# Patient Record
Sex: Male | Born: 1990 | Race: Black or African American | Hispanic: No | Marital: Single | Smoking: Current every day smoker
Health system: Southern US, Community
[De-identification: ages and names within clinical notes are randomized; demographics above are authoritative.]

---

## 2011-02-10 ENCOUNTER — Emergency Department: Payer: Self-pay | Admitting: Emergency Medicine

## 2011-04-27 ENCOUNTER — Emergency Department: Payer: Self-pay | Admitting: Emergency Medicine

## 2011-05-02 ENCOUNTER — Emergency Department: Payer: Self-pay | Admitting: Unknown Physician Specialty

## 2012-08-15 ENCOUNTER — Emergency Department: Payer: Self-pay | Admitting: Emergency Medicine

## 2012-09-05 ENCOUNTER — Emergency Department: Payer: Self-pay | Admitting: Emergency Medicine

## 2012-10-09 ENCOUNTER — Emergency Department: Payer: Self-pay | Admitting: Emergency Medicine

## 2016-05-17 ENCOUNTER — Emergency Department: Payer: No Typology Code available for payment source

## 2016-05-17 ENCOUNTER — Encounter: Payer: Self-pay | Admitting: Emergency Medicine

## 2016-05-17 ENCOUNTER — Emergency Department
Admission: EM | Admit: 2016-05-17 | Discharge: 2016-05-17 | Disposition: A | Discharge status: Home / Self Care | Payer: No Typology Code available for payment source | Attending: Emergency Medicine | Admitting: Emergency Medicine

## 2016-05-17 DIAGNOSIS — R079 Chest pain, unspecified: Secondary | ICD-10-CM | POA: Diagnosis not present

## 2016-05-17 DIAGNOSIS — S42001A Fracture of unspecified part of right clavicle, initial encounter for closed fracture: Secondary | ICD-10-CM

## 2016-05-17 DIAGNOSIS — Y92410 Unspecified street and highway as the place of occurrence of the external cause: Secondary | ICD-10-CM | POA: Insufficient documentation

## 2016-05-17 DIAGNOSIS — F17210 Nicotine dependence, cigarettes, uncomplicated: Secondary | ICD-10-CM | POA: Diagnosis not present

## 2016-05-17 DIAGNOSIS — S42101A Fracture of unspecified part of scapula, right shoulder, initial encounter for closed fracture: Secondary | ICD-10-CM | POA: Insufficient documentation

## 2016-05-17 DIAGNOSIS — Y999 Unspecified external cause status: Secondary | ICD-10-CM | POA: Insufficient documentation

## 2016-05-17 DIAGNOSIS — Y939 Activity, unspecified: Secondary | ICD-10-CM | POA: Insufficient documentation

## 2016-05-17 DIAGNOSIS — R51 Headache: Secondary | ICD-10-CM | POA: Diagnosis not present

## 2016-05-17 DIAGNOSIS — M25511 Pain in right shoulder: Secondary | ICD-10-CM | POA: Diagnosis present

## 2016-05-17 LAB — BASIC METABOLIC PANEL
Anion gap: 9 (ref 5–15)
BUN: 11 mg/dL (ref 6–20)
CO2: 27 mmol/L (ref 22–32)
Calcium: 9.5 mg/dL (ref 8.9–10.3)
Chloride: 105 mmol/L (ref 101–111)
Creatinine, Ser: 0.96 mg/dL (ref 0.61–1.24)
GFR calc Af Amer: >60 mL/min (ref >60)
GFR calc non Af Amer: >60 mL/min (ref >60)
Glucose, Bld: 104 mg/dL — ABNORMAL HIGH (ref 65–99)
Potassium: 3.7 mmol/L (ref 3.5–5.1)
Sodium: 141 mmol/L (ref 135–145)

## 2016-05-17 LAB — CBC
HCT: 44.8 % (ref 40.0–52.0)
HEMOGLOBIN: 15.1 g/dL (ref 13.0–18.0)
MCH: 30.3 pg (ref 26.0–34.0)
MCHC: 33.6 g/dL (ref 32.0–36.0)
MCV: 90.3 fL (ref 80.0–100.0)
PLATELETS: 179 10*3/uL (ref 150–440)
RBC: 4.96 MIL/uL (ref 4.40–5.90)
RDW: 14.3 % (ref 11.5–14.5)
WBC: 14 10*3/uL — ABNORMAL HIGH (ref 3.8–10.6)

## 2016-05-17 MED ORDER — FENTANYL CITRATE (PF) 100 MCG/2ML IJ SOLN
50.0000 ug | Freq: Once | INTRAMUSCULAR | Status: AC
  Administered 2016-05-17: 50 ug via INTRAVENOUS
  Filled 2016-05-17: qty 2

## 2016-05-17 MED ORDER — IOPAMIDOL (ISOVUE-300) INJECTION 61%
75.0000 mL | Freq: Once | INTRAVENOUS | Status: AC | PRN
Start: 2016-05-17 — End: 2016-05-17
  Administered 2016-05-17: 75 mL via INTRAVENOUS
  Filled 2016-05-17: qty 75

## 2016-05-17 MED ORDER — OXYCODONE-ACETAMINOPHEN 5-325 MG PO TABS: 1.0000 | ORAL_TABLET | Freq: Four times a day (QID) | ORAL | Status: AC | PRN
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
## 2016-05-28 ENCOUNTER — Encounter: Payer: Self-pay | Admitting: Emergency Medicine

## 2016-05-28 ENCOUNTER — Emergency Department
Admission: EM | Admit: 2016-05-28 | Discharge: 2016-05-28 | Disposition: A | Discharge status: Home / Self Care | Payer: No Typology Code available for payment source | Attending: Emergency Medicine | Admitting: Emergency Medicine

## 2016-05-28 DIAGNOSIS — F17210 Nicotine dependence, cigarettes, uncomplicated: Secondary | ICD-10-CM | POA: Diagnosis not present

## 2016-05-28 DIAGNOSIS — S42001S Fracture of unspecified part of right clavicle, sequela: Secondary | ICD-10-CM | POA: Diagnosis not present

## 2016-05-28 DIAGNOSIS — S42101S Fracture of unspecified part of scapula, right shoulder, sequela: Secondary | ICD-10-CM | POA: Insufficient documentation

## 2016-05-28 DIAGNOSIS — M25511 Pain in right shoulder: Secondary | ICD-10-CM | POA: Diagnosis present

## 2016-05-28 MED ORDER — OXYCODONE-ACETAMINOPHEN 5-325 MG PO TABS
1.0000 | ORAL_TABLET | Freq: Once | ORAL | Status: AC
  Administered 2016-05-28: 1 via ORAL
  Filled 2016-05-28: qty 1

## 2016-05-28 MED ORDER — OXYCODONE-ACETAMINOPHEN 5-325 MG PO TABS: 1.0000 | ORAL_TABLET | Freq: Four times a day (QID) | ORAL | 0 refills | Status: AC | PRN
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
## 2016-05-28 NOTE — ED Notes (Signed)
See triage note  conts to have pain in shoulder  And out of pain meds   Shoulder is in immobilizer in place
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
## 2016-05-28 NOTE — Discharge Instructions (Signed)
Wear sling/immobilizer until evaluation by orthopedics

## 2016-06-02 ENCOUNTER — Emergency Department (HOSPITAL_COMMUNITY)
Admission: EM | Admit: 2016-06-02 | Discharge: 2016-06-02 | Disposition: A | Discharge status: Home / Self Care | Payer: No Typology Code available for payment source | Attending: Emergency Medicine | Admitting: Emergency Medicine

## 2016-06-02 ENCOUNTER — Emergency Department (HOSPITAL_COMMUNITY): Payer: No Typology Code available for payment source

## 2016-06-02 ENCOUNTER — Encounter (HOSPITAL_COMMUNITY): Payer: Self-pay | Admitting: Emergency Medicine

## 2016-06-02 DIAGNOSIS — S42021D Displaced fracture of shaft of right clavicle, subsequent encounter for fracture with routine healing: Secondary | ICD-10-CM | POA: Insufficient documentation

## 2016-06-02 DIAGNOSIS — S42114A Nondisplaced fracture of body of scapula, right shoulder, initial encounter for closed fracture: Secondary | ICD-10-CM | POA: Insufficient documentation

## 2016-06-02 DIAGNOSIS — Y929 Unspecified place or not applicable: Secondary | ICD-10-CM | POA: Diagnosis not present

## 2016-06-02 DIAGNOSIS — Z91010 Allergy to peanuts: Secondary | ICD-10-CM | POA: Diagnosis not present

## 2016-06-02 DIAGNOSIS — S42001D Fracture of unspecified part of right clavicle, subsequent encounter for fracture with routine healing: Secondary | ICD-10-CM

## 2016-06-02 DIAGNOSIS — Y939 Activity, unspecified: Secondary | ICD-10-CM | POA: Insufficient documentation

## 2016-06-02 DIAGNOSIS — F17210 Nicotine dependence, cigarettes, uncomplicated: Secondary | ICD-10-CM | POA: Insufficient documentation

## 2016-06-02 DIAGNOSIS — S42101A Fracture of unspecified part of scapula, right shoulder, initial encounter for closed fracture: Secondary | ICD-10-CM

## 2016-06-02 DIAGNOSIS — S4991XD Unspecified injury of right shoulder and upper arm, subsequent encounter: Secondary | ICD-10-CM | POA: Diagnosis present

## 2016-06-02 DIAGNOSIS — Y999 Unspecified external cause status: Secondary | ICD-10-CM | POA: Insufficient documentation

## 2016-06-02 MED ORDER — IBUPROFEN 800 MG PO TABS: 800.0000 mg | ORAL_TABLET | Freq: Three times a day (TID) | ORAL | 0 refills | Status: AC | PRN
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
## 2016-06-02 NOTE — Progress Notes (Signed)
Orthopedic Tech Progress Note Patient Details:  David Paul Xxxhenderson Samuels 12/05/1990 [id number]  Ortho Devices Type of Ortho Device: Shoulder immobilizer Ortho Device/Splint Interventions: Application   Saul FordyceJennifer C Margaree Sandhu 06/02/2016, 1:17 PM

## 2016-06-02 NOTE — ED Notes (Signed)
othro tech in room , placed sling on pt
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
## 2016-10-01 ENCOUNTER — Ambulatory Visit: Payer: No Typology Code available for payment source | Admitting: Physical Therapy
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
